# Patient Record
Sex: Female | Born: 2005 | Race: White | Hispanic: No | Marital: Single | Smoking: Never smoker
Health system: Southern US, Community
[De-identification: ages and names within clinical notes are randomized; demographics above are authoritative.]

## PROBLEM LIST (undated history)

## (undated) DIAGNOSIS — F4310 Post-traumatic stress disorder, unspecified: Secondary | ICD-10-CM

## (undated) DIAGNOSIS — F942 Disinhibited attachment disorder of childhood: Secondary | ICD-10-CM

## (undated) DIAGNOSIS — F919 Conduct disorder, unspecified: Secondary | ICD-10-CM

## (undated) DIAGNOSIS — F419 Anxiety disorder, unspecified: Secondary | ICD-10-CM

## (undated) DIAGNOSIS — F909 Attention-deficit hyperactivity disorder, unspecified type: Secondary | ICD-10-CM

## (undated) DIAGNOSIS — F801 Expressive language disorder: Secondary | ICD-10-CM

## (undated) DIAGNOSIS — H539 Unspecified visual disturbance: Secondary | ICD-10-CM

## (undated) DIAGNOSIS — F941 Reactive attachment disorder of childhood: Secondary | ICD-10-CM

## (undated) DIAGNOSIS — F819 Developmental disorder of scholastic skills, unspecified: Secondary | ICD-10-CM

## (undated) DIAGNOSIS — R569 Unspecified convulsions: Secondary | ICD-10-CM

## (undated) HISTORY — DX: Post-traumatic stress disorder, unspecified: F43.10

## (undated) HISTORY — DX: Anxiety disorder, unspecified: F41.9

## (undated) HISTORY — PX: OTHER SURGICAL HISTORY: SHX169

## (undated) HISTORY — DX: Developmental disorder of scholastic skills, unspecified: F81.9

---
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
## 2024-01-22 NOTE — ED Provider Notes (Signed)
 Emergency Department Provider Note    ED Clinical Impression   Final diagnoses:  Seizure-like activity    (Primary)    ED Assessment/Plan    History   Chief Complaint  Patient presents with  . Seizure - Prior History Of   Kathleen Henderson is an 18 year old female with a history of nonepileptiform seizures, who was seen in the emergency department last month for this, who had normal labs and noncontrast head CT, who presents today for the same.  According to triage report, she had what appeared to be a generalized tonic-clonic seizure that lasted about 10 minutes.  There was no urinary incontinence.  No intraoral trauma.  She is currently able to tell me her fianc's name.  She denies any pain or dyspnea.  She is unable to tell me what happened earlier this evening.     Past Medical History[1]  Past Surgical History[2]  Family History[3]  Social History[4]  Review of Systems  Physical Exam   BP 108/66   Pulse 65   Temp 37.2 C (98.9 F) (Oral)   Resp 16   Ht 170.2 cm (5' 7)   Wt 68 kg (150 lb)   SpO2 97%   BMI 23.49 kg/m   Physical Exam Vitals and nursing note reviewed.  Constitutional:      General: She is not in acute distress.    Appearance: Normal appearance. She is well-developed. She is not ill-appearing.  HENT:     Head: Normocephalic.     Mouth/Throat:     Mouth: Mucous membranes are moist.   Eyes:     Extraocular Movements: Extraocular movements intact.     Conjunctiva/sclera: Conjunctivae normal.     Pupils: Pupils are equal, round, and reactive to light.    Cardiovascular:     Rate and Rhythm: Normal rate and regular rhythm.     Heart sounds: No murmur heard. Pulmonary:     Effort: Pulmonary effort is normal. No respiratory distress.     Breath sounds: Normal breath sounds. No wheezing.  Abdominal:     General: There is no distension.     Tenderness: There is no abdominal tenderness.   Musculoskeletal:        General: No swelling or signs  of injury.   Skin:    General: Skin is warm.     Capillary Refill: Capillary refill takes less than 2 seconds.   Neurological:     Mental Status: She is alert.     Motor: No weakness.     Gait: Gait normal.     Comments: Full strength in her arms and legs.  Following commands.  GCS 15.  Psychiatric:        Mood and Affect: Mood normal.     ED Course       Medical Decision Making Tired but well-appearing 18 year old female with normal vital signs, who had a normal noncontrast head CT and labs last month, presents today for witnessed seizure-like activity.  She has a history of nonepileptic form seizure activity.  No further intervention needed.  She denies any thoughts of going to harm her self or others.  I spoke with her fianc, Kathleen Henderson, who is aware of her diagnosis.  He will send one of the roommates to pick her up from the emergency department.  A work note for tomorrow has been provided.                 [1] No past medical history on file. [  2] No past surgical history on file. [3] No family history on file. [4] Social History Socioeconomic History  . Marital status: Single   Social Drivers of Corporate investment banker Strain: Low Risk  (05/16/2023)   Received from [facility]   Overall Physicist, medical Strain (CARDIA)   . Difficulty of Paying Living Expenses: Not hard at all  Food Insecurity: No Food Insecurity (08/28/2023)   [facility]   Hunger Vital Sign   . Within the past 12 months, you worried that your food would run out before you got the money to buy more.: Never true   . Within the past 12 months, the food you bought just didn't last and you didn't have money to get more.: Never true  Transportation Needs: No Transportation Needs (08/28/2023)   Received from [facility] - Transportation   . Lack of Transportation (Medical): No   . Lack of Transportation (Non-Medical): No  Housing: Patient Unable  To Answer (06/07/2023)   [facility]   Housing Stability Vital Sign   . In the last 12 months, was there a time when you were not able to pay the mortgage or rent on time?: Patient unable to answer   . In the past 12 months, how many times have you moved where you were living?: 0   . At any time in the past 12 months, were you homeless or living in a shelter (including now)?: Patient unable to answer   Marnie Doyal Amble, MD 01/23/24 [phone]

## 2024-01-22 NOTE — ED Triage Notes (Signed)
 Pt. arrives via EMS from [facility] r/t to witnessed tonic-clonic seizure lasting approx. 10 minutes, per bystanders.  Pt. has a hx of the same.  Per report, pt. fell to ground striking the left side of her face.  Pt. drowsy but able to respond to questions during triage.

## 2024-01-31 NOTE — Progress Notes (Signed)
 Care Manager Initial Evaluation Note  SW CM met with 18 y.o. Kathleen Henderson was admitted to the [facility] for suicidal ideation. Kathleen Henderson stated that her PCP changed her medication and said if she started to have any suicidal ideation, to stop taking her medication. Kathleen Henderson stated she met with her PCP and suggested that she come to the hospital for medication management and acute stabilization. Kathleen Henderson noted that she has a great support system. Kathleen Henderson will need to establish mental health providers. Kathleen Henderson will not require any transportation upon discharge. Kathleen Henderson will need follow-up appointments scheduled after hospitalization.  Validate Info on Facesheet The following items were validated: Patient's name, Address, Everyday contact #, PCP, Emergency contact, Emergency phone #, Insurance  Nsg Discharge Planning Does the patient need discharge transport arranged?: No  Support and Level of Function Primary role/responsibilities: Wage earner, full-time Do you have someone who is willing and able to assist you after you leave the hospital?: No assistance needed Are you able to perform all of your activities of daily living (ADLs)?: Yes Are you able to perform all of your independent activities of daily living?: Yes  Current PAC Services/Placement Patient in a facility prior to admission: No Current home skilled services: None Current community resources: None Current outpatient skilled services: None Current support group: Mental health counseling, None Current DME utilized: None  Employment Employment Status: employed full-time  Editor, commissioning  In the past 12 months has the Mining engineer, gas, oil, or water company threatened to shut off services in your home?: No  Housing What is your living situation today?: I have a steady place to live Think about the place you live. Do you have problems with any of the following?: None of the above Potentially Unsafe Housing Conditions: unable to  assess  Transportation  In the past 12 months, has lack of reliable transportation kept you from medical appointments, meetings, work or from getting things needed for daily living?: No  Food  Within the past 12 months, you worried that your food would run out before you got money to buy more.: Never true Within the past 12 months, the food you bought just didn't last and you didn't have money to get more.: Never true  Trauma/Safety Assessment: Because violence and abuse happens to a lot of people and affects their health we are asking the following questions: How often does anyone, including family and friends, physically hurt you?: Never How often does anyone, including family and friends, insult or talk down to you?: Never How often does anyone, including family and friends, threaten you with harm?: Never How often does anyone, including family and friends, scream or curse at you?: Never Safety Score: 4 Thoughts of Harm to Others: No How often does anyone exploit you or take your resources unfairly for their benefit?: Never How often does anyone expect or demand you to engage in behaviors that you don't want to do for the their benefit?: Never  Violence Risk Screening Assessment of Violence: None noted History of Violence in Healthcare: No Thoughts of Harm to Others: No  Home Living Patient expresses concerns regarding home layout: No  Transition Needs Evaluation Anticipated discharge disposition: Home or Self Care Post discharge needs: None Ambulatory Palliative Care Consult addressed by CM: No (comment) Support group needs for discharge: Outpatient mental health care Outpatient mental health care: Psychiatric diagnosis Outpatient skilled service needs for discharge: None Does the patient need discharge transport arranged?: No CM Complex Discharge Considerations: None Patient/family/HCA in agreement with plan: Yes Understanding  of Condition and Treatment: adequate  understanding of medical condition, adequate understanding of treatment  Next Eval Due Date Next eval due date: 02/02/24
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
## 2024-02-01 NOTE — Care Plan (Signed)
 Goal Outcome Evaluation:  Plan of Care Reviewed With: patient  Progress: improving
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
## 2024-02-01 NOTE — Group Note (Signed)
 Group Topic: Healing Arts  Group Date: 02/01/2024 Start Time: 1030 End Time: 1145 Facilitators:   Department: [facility]  Number of Attendees: 10  Group Focus: art therapy, coping skills, feeling awareness/expression, self-awareness, and self-esteem Treatment Modality: Art Therapy Interventions utilized were clarification, exploration, problem solving, story telling, support, and music, creativity, grounding, reading Purpose: enhance coping skills, express feelings, express irrational fears, improve communication skills, increase insight, regain self-worth, reinforce self-care, and Using creative mindful self-expression and art therapy process create mixed media artwork following invitation to depict going with the flow and/ or to affirm our self, including in the 5 realms of our being: physical/ body, cognitive, emotional, spiritual, and relational.  Grounding practices: reading and intentional mark-making process. Name: Kathleen Henderson Date of Birth: [birth year]  MR: [id number]    Level of Participation: active  Quality of Participation: engaged Interactions with others: isolative and gave feedback Mood/Affect: anxious, appropriate, and closed / guarded Triggers (if applicable): first day in hospital Cognition: concrete, goal directed, and logical Progress: Moderate Response: Kathleen Bers'  participated in brief initial grounding exercise. She completed mixed media collage artwork following invitation to depict going with the flow and/or to affirm her self, using images and words.  [name] appeared to finish her initial collage quickly, art therapist taught how to make a mini-book. [name] added pictures to her book and added her book to her collage. She shared her artwork, stating how much she is missing her dog, a rescued [place]. Participant reported (+) response to art making  Plan: Continue to offer therapeutic expressive arts groups in  service of treatment goals.  Patients Problems:  Patient Active Problem List  Diagnosis  . Depression with suicidal ideation

## 2024-02-02 NOTE — Discharge Summary (Signed)
 Discharge Summary  Name: Kathleen Henderson DOB / AGE: [birth year] / 18 y.o. 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 She has been in residential facility in the past. Lately she has been more depressed and has some active suicidal thoughts. She was brought to ED for that.   Discharge Diagnosis Depression with suicidal ideation  Recapitulation of the Patient's Hospitalization 07/09: Stop Lexapro . Start Lamictal  25mg  po every day. Order EKG and 500cc of Normal saline due to hypotension and vomitting.Counseling provided with focus on her depression and her anxiety. 07/10: Start Keppra  500mg  po BID and order MRI of head. Counseling provided with focus on her depression and anxiety.  Significant Findings/Tests/Studies MRI negative for acute abnormality Labs No results found for this or any previous visit (from the past 24 hours).  Transition Plan Discharge home and back to outpatient level of care.   A Brief Summary of the Patient's Condition at Discharge Patient is feeling better with medication changes and hopeful to return home. She feels safe and stable.  Mental Status/Cognition Orientation:Alert and oriented x 4 Appearance: Appropriately groomed, appears stated age? Attitude: Appropriately focused on healing  Gait: Age-appropriate Behavior: Cooperative. Attention Span and Concentration: Both intact. Memory: Intact to context and conversation Speech: Normal rate, flow, and volume. Organized and coherent. Language/Fund of knowledge: Consistent with background and education. Thought Process: Organized and coherent. No looseness of associations. Thought Content: No suicidal thoughts, no homicidal thoughts, no delusions, and no psychosis. Thought Perception: No auditory hallucinations, no visual  hallucinations. Mood: depressed, improving Affect: congruent Judgment: Fair Insight: Fair   Visit Vitals BP (!) 103/56  Pulse 53  Temp 36.8 C (98.2 F)  Resp 17  Ht 1.702 m (5' 7)  Wt 67.1 kg (147 lb 14.9 oz)  SpO2 96%  BMI 23.17 kg/m  OB Status Having periods  Smoking Status Every Day  BSA 1.78 m    Discharge Instructions Follow Up:    Thursday Feb 08, 2024   Appointment with [facility] at  1:00 PM   Where: [address] [place] [address]    Tuesday Feb 27, 2024   Appointment with [facility] at  3:30 PM   Where: [address] [place] [address]               Allergies[1] Current Discharge Medication List     START   Details  lamoTRIgine  (LaMICtal ) 25 MG tablet Take 1 tablet (25 mg total) by mouth 1 (one) time each day.    levETIRAcetam  (Keppra ) 500 MG tablet Take 1 tablet (500 mg total) by mouth in the morning and 1 tablet (500 mg total) before bedtime.       CONTINUE these medications which have NOT CHANGED   Details  cholecalciferol (Vitamin D-3) 50 MCG (2000 UT) capsule Take 1 capsule (2,000 Units total) by mouth 1 (one) time each day.    guanFACINE (Intuniv) 1 mg 24 hr tablet Take 1 tablet (1 mg total) by mouth 1 (one) time each day.    naproxen (EC Naprosyn) 375 MG EC tablet Take 1 tablet (375 mg total) by mouth 1 (one) time each day. Do not crush, chew, or split.    pantoprazole  (Protonix ) 20 MG EC tablet Take 1 tablet (20 mg total) by mouth  1 (one) time each day before breakfast. Do not crush, chew, or split.       STOP     escitalopram  (Lexapro ) 5 MG tablet      promethazine  (Phenergan ) 12.5 MG tablet         Professional Services  Time Summary Total time was 45 minutes with the patient in the evaluation. This includes time for chart review, discussion with team, formal time with patient, E and M time, >50% of which was spent in counseling and coordination of care.          [1] Allergies Allergen Reactions  . Shrimp (Diagnostic) Anaphylaxis    States her throat closes  . Penicillins Hives  . Trileptal  [Oxcarbazepine ]   . Wellbutrin [Bupropion]   . Other Food Allergy Rash    Allergic to [employer]

## 2024-02-02 NOTE — Care Plan (Signed)
 Goal Outcome Evaluation  Plan of Care Reviewed With: patient Progress: no change    Patient appears with flat affect but is cooperative. Denies anxiety and depression. Interaction has been minimal. Denies SI and pain. Has had no seizure activity at this time. Isolates and has been resting in room most of day. No behaviors to note. Will continue to monitor Q 15 min.

## 2024-02-02 NOTE — Progress Notes (Signed)
 Progress Note  Name: Kathleen Henderson DOB / AGE: 12-Jul-2006 / 18 y.o. MRN: [id number]  CSN: [id number] Date of Admission: 01/31/2024  Chief Complaint Depression with [facility] Chief Complaint  Patient presents with  . Suicidal    Pt coming from home with a mobile crisis nurse chief complaint is suicidal thoughts. Pt filled out a suicide risk form at her primary care office that alerted mobile crisis. Pt states she has been suicidal for the past 3 weeks. Hx of autism and pseudoseizures.     Subjective/Interval History Reviewed Staff Charting Care discussed with treatment team including nursing and social work.   Interval or Acute Events Patient had an episode of possible seizure. She has history of new onset seizures last year and was placed on Keppra  in the past. She is off Keppra  now for six months. Still depressed and anxious. Seems slightly confused after the episode. Medication Needs Overnight Anxiety and depression Tolerability of Medications and Engagement in Treatment Compliant with medications. Unable to attend groups  HPI  Review of Systems  Vital Signs   02/01/24 1019 02/01/24 1957 02/02/24 0745  BP: (!) 122/59 114/68 (!) 103/56  Pulse: 67 57 53  Resp:  20 17  Temp: 36.6 C (97.9 F) 36.9 C (98.4 F) 36.8 C (98.2 F)  SpO2: 100% 100% 96%      Body mass index is 23.17 kg/m.   Intake/Output Summary (Last 24 hours) at 02/02/2024 1018 Last data filed at 02/02/2024 0900 Gross per 24 hour  Intake 700 ml  Output --  Net 700 ml    Physical Exam  Diagnostic Studies Review Labs No results found for this or any previous visit (from the past 24 hours). Radiology No imaging results in the past day.  ECG Interpretation   Medications calcium carbonate-vitamin D, 1 tablet, Oral, BID cholecalciferol, 2,000 Units, Oral, Daily guanFACINE, 1 mg, Oral, Nightly lamoTRIgine , 25 mg, Oral, Daily levETIRAcetam , 500 mg, Oral, BID multivitamin, 1 tablet, Oral,  Daily naproxen, 250 mg, Oral, BID with meals nicotine, 1 patch, Transdermal, Daily  Followed by NOREEN ON 03/13/2024] nicotine, 1 patch, Transdermal, Daily  Followed by NOREEN ON 03/27/2024] nicotine, 1 patch, Transdermal, Daily pantoprazole , 40 mg, Oral, Daily before breakfast thiamine, 100 mg, Oral, Daily      PRN medications: acetaminophen , acetaminophen , aluminum & magnesium  hydroxide-simethicone, glucose **OR** [DISCONTINUED] glucose, haloperidol lactate, hydrOXYzine  HCl, LORazepam , magnesium  hydroxide, nicotine polacrilex, OLANZapine, ondansetron  ODT, propranolol  Sleep Assessment Sleep Pattern: Disturbed/interrupted sleep Average Number of Sleep Hours: 5 Hours Total Hours of Sleep (Previous Night): 7 Restful Sleep: No (Comment) Difficulty Falling Asleep: Yes (Comment) Difficulty Staying Asleep: Yes (Comment)  Assessment Results   Mental Status/Cognition  Orientation:Alert and oriented x 4 Appearance: Appropriately groomed, appears stated age? Attitude: Appropriately focused on healing but fearful Gait: Age-appropriate Behavior: Cooperative. Attention Span and Concentration: Both intact. Memory: Intact to context and conversation Speech: Normal rate, flow, and volume. Organized and coherent. Language/Fund of knowledge: Consistent with background and education. Thought Process: Organized and coherent. No looseness of associations. Thought Content: No suicidal thoughts, no homicidal thoughts, no delusions, and no psychosis. Thought Perception: No auditory hallucinations, no visual hallucinations. Mood: anxious Affect: Anxious-appearing Judgment: Fair Insight: Fair    Psychiatric Diagnostic Formulation A 18 years old with history of PTSD and increasing depression lately  Diagnostic Impression Problem List         ICD-10-CM Class     Hospital   * (Principal) Depression with suicidal ideation F32.A, R45.851    Other  Visit Diagnoses       Codes     Suicidal  ideation    -  Primary R45.851       Treatment Plan Psychiatric  07/09: Stop Lexapro . Start Lamictal  25mg  po every day. Order EKG and 500cc of Normal saline due to hypotension and vomitting.Counseling provided with focus on her depression and her anxiety. 07/10: Start Keppra  500mg  po BID and order MRI of head. Counseling provided with focus on her depression and anxiety. Metabolic Screen HbA1c ordered Estimated Suicide and/or Violence Risk severe Patient Psychosocial Assets/Strengths  Patient is willing to participate in treatment Patient Psychosocial Liabilities/Weakness  Poor coping skills Disposition/Estimated Length of Stay 3-5 days Chemical Dependency Treatment none Medical POTS, Ehlears-Danlop syndrome.   Professional Services  Time Summary Total time was 55 minutes with the patient in the evaluation. This includes time for chart review, discussion with team, formal time with patient, E and M time. Greater than 50% was spent in counseling and coordination of care.   Counseling Summary I spent 20 minutes in a counseling session with the patient. I utilized a combination of brief supportive therapy and motivational interviewing. Targeted her anxiety and depressvie symptoms? Response to therapy was excellent.? The patient will need ongoing reinforcement.

## 2024-02-02 NOTE — Group Note (Signed)
 Group Topic: Goals  Group Date: 02/02/2024 Start Time: 0915 End Time: 1000 Facilitators:   Department: [facility]  Number of Attendees: 10  Group Focus: goals/reality orientation Treatment Modality: Behavior Modification Therapy, Cognitive Behavioral Therapy, and Psychoeducation Interventions utilized were exploration, goal setting, patient education, and support Purpose: Goal Group seeks to enhance coping skills, explore maladaptive thinking, increase insight, regain self-worth, and reinforce self-care by inviting patients to select 2 goals for direction and confidence-building and developing insight into what thoughts/feelings might interfere. Self-esteem as a practice introduced, as well as checking in on current safety level, with encouragement to come to staff with safety concerns. Name: Kathleen Henderson Date of Birth: Mar 06, 2006  MR: [id number]    Level of Participation: unavailable  Reason: [place] was asleep at the time of group Plan: Cont to invite to groups as they may benefit  Patients Problems:  Patient Active Problem List  Diagnosis  . Depression with suicidal ideation

## 2024-02-03 NOTE — Care Plan (Signed)
 Kathleen Henderson was discharged to her Kathleen Henderson home 02/02/2024 with family providing transportation. She will follow up with Kathleen Henderson for therapy with Kathleen Henderson on Feb 08, 2024 at 1:00 PM, who will offer substance abuse and/or tobacco use counseling. She will follow  up with Kathleen Henderson for medication management at Kathleen Henderson on Feb 27, 2024 3:30 PM. Kathleen Henderson agrees with the plan and verbalizes understanding.

## 2024-02-05 NOTE — Progress Notes (Signed)
 Care Manager Final Note  The patient stabilized in her treatment plan. She is psychiatrically ready for discharge and will be discharged home.   Scheduled Appointments  Feb 08, 2024 1:00 PM Appointment with [facility] [address] [place] [address] [phone]  Provider: Kathern Leopard Feb 27, 2024 3:30 PM Appointment with [facility] [address] [place] [address] [phone]

## 2024-02-15 ENCOUNTER — Ambulatory Visit: Payer: MEDICAID | Admitting: Advanced Practice Midwife

## 2024-02-15 ENCOUNTER — Encounter: Payer: Self-pay | Admitting: Advanced Practice Midwife

## 2024-02-15 ENCOUNTER — Other Ambulatory Visit (HOSPITAL_COMMUNITY)
Admission: RE | Admit: 2024-02-15 | Discharge: 2024-02-15 | Disposition: A | Discharge status: Home / Self Care | Payer: MEDICAID | Source: Ambulatory Visit

## 2024-02-15 ENCOUNTER — Emergency Department (HOSPITAL_COMMUNITY)
Admission: EM | Admit: 2024-02-15 | Discharge: 2024-02-15 | Disposition: A | Discharge status: Home / Self Care | Payer: MEDICAID | Attending: Emergency Medicine | Admitting: Emergency Medicine

## 2024-02-15 ENCOUNTER — Emergency Department (HOSPITAL_COMMUNITY): Payer: MEDICAID

## 2024-02-15 VITALS — BP 104/67 | HR 75

## 2024-02-15 DIAGNOSIS — R569 Unspecified convulsions: Secondary | ICD-10-CM | POA: Diagnosis present

## 2024-02-15 DIAGNOSIS — N898 Other specified noninflammatory disorders of vagina: Secondary | ICD-10-CM | POA: Diagnosis not present

## 2024-02-15 DIAGNOSIS — R350 Frequency of micturition: Secondary | ICD-10-CM | POA: Diagnosis not present

## 2024-02-15 DIAGNOSIS — B9689 Other specified bacterial agents as the cause of diseases classified elsewhere: Secondary | ICD-10-CM | POA: Diagnosis not present

## 2024-02-15 DIAGNOSIS — N926 Irregular menstruation, unspecified: Secondary | ICD-10-CM

## 2024-02-15 LAB — BASIC METABOLIC PANEL WITH GFR
Anion gap: 7 (ref 5–15)
BUN: 10 mg/dL (ref 6–20)
CO2: 22 mmol/L (ref 22–32)
Calcium: 8.4 mg/dL — ABNORMAL LOW (ref 8.9–10.3)
Chloride: 107 mmol/L (ref 98–111)
Creatinine, Ser: 0.85 mg/dL (ref 0.44–1.00)
GFR, Estimated: >60 mL/min (ref >60)
Glucose, Bld: 95 mg/dL (ref 70–99)
Potassium: 4 mmol/L (ref 3.5–5.1)
Sodium: 136 mmol/L (ref 135–145)

## 2024-02-15 LAB — CBC
HCT: 33.3 % — ABNORMAL LOW (ref 36.0–46.0)
Hemoglobin: 10.9 g/dL — ABNORMAL LOW (ref 12.0–15.0)
MCH: 27.9 pg (ref 26.0–34.0)
MCHC: 32.7 g/dL (ref 30.0–36.0)
MCV: 85.2 fL (ref 80.0–100.0)
Platelets: 234 K/uL (ref 150–400)
RBC: 3.91 MIL/uL (ref 3.87–5.11)
RDW: 13.8 % (ref 11.5–15.5)
WBC: 7.2 K/uL (ref 4.0–10.5)
nRBC: 0 % (ref 0.0–0.2)

## 2024-02-15 LAB — I-STAT CHEM 8, ED
BUN: 10 mg/dL (ref 6–20)
Calcium, Ion: 1.2 mmol/L (ref 1.15–1.40)
Chloride: 105 mmol/L (ref 98–111)
Creatinine, Ser: 0.9 mg/dL (ref 0.44–1.00)
Glucose, Bld: 92 mg/dL (ref 70–99)
HCT: 30 % — ABNORMAL LOW (ref 36.0–46.0)
Hemoglobin: 10.2 g/dL — ABNORMAL LOW (ref 12.0–15.0)
Potassium: 3.8 mmol/L (ref 3.5–5.1)
Sodium: 140 mmol/L (ref 135–145)
TCO2: 22 mmol/L (ref 22–32)

## 2024-02-15 LAB — POCT URINALYSIS DIPSTICK OB
Glucose, UA: NEGATIVE
Ketones, UA: NEGATIVE
Nitrite, UA: NEGATIVE
POC,PROTEIN,UA: NEGATIVE

## 2024-02-15 LAB — CK: Total CK: 96 U/L (ref 38–234)

## 2024-02-15 LAB — POCT URINE PREGNANCY: Preg Test, Ur: NEGATIVE

## 2024-02-15 LAB — HCG, SERUM, QUALITATIVE: Preg, Serum: NEGATIVE

## 2024-02-15 LAB — CBG MONITORING, ED: Glucose-Capillary: 94 mg/dL (ref 70–99)

## 2024-02-15 MED ORDER — LACTATED RINGERS IV BOLUS
1000.0000 mL | Freq: Once | INTRAVENOUS | Status: AC
  Administered 2024-02-15: 1000 mL via INTRAVENOUS

## 2024-02-15 MED ORDER — METRONIDAZOLE 500 MG PO TABS: 500.0000 mg | ORAL_TABLET | Freq: Two times a day (BID) | ORAL | 0 refills | Status: AC

## 2024-02-15 MED ORDER — LEVETIRACETAM (KEPPRA) 500 MG/5 ML ADULT IV PUSH: 1500.0000 mg | Freq: Once | INTRAVENOUS | Status: DC

## 2024-02-15 MED ORDER — PNV PRENATAL PLUS MULTIVITAMIN 27-1 MG PO TABS: 1.0000 | ORAL_TABLET | Freq: Every day | ORAL | 11 refills | Status: AC

## 2024-02-15 NOTE — ED Provider Notes (Signed)
 [place] EMERGENCY DEPARTMENT AT [facility] Provider Note  CSN: [id number] Arrival date & time: 02/15/24 1436  Chief Complaint(s) Seizures  HPI Kathleen Henderson is a 18 y.o. female with past medical history as below, significant for ADHD, anxiety,, disorder, PTSD, psychogenic nonepileptic seizures, MDD, who presents to the ED with complaint of seizure like activity  According to EMS patient was at the pool, she had report of 3 episodes of convulsions, seizure-like activity.  She was given Versed intramuscular by EMS and upon arrival she is sleepy, possibly postictal but she was recently sedated.  Prior to receiving the Versed EMS reports that patient reported that she was compliant with her Keppra .  No seizure activity upon arrival to the ER.    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, disinhibited type    Seizures (HCC)    psychogenic, nonepileptic   Vision abnormalities    Pt reports she wears glasses at school   Patient Active Problem List   Diagnosis Date Noted   Psychogenic nonepileptic seizure 08/29/2023   Anxiety and depression 08/28/2023   Seizure-like activity (HCC) 08/27/2023   GERD (gastroesophageal reflux disease) 08/27/2023   Witnessed seizure-like activity (HCC) 08/13/2023   MDD (major depressive disorder), recurrent episode, severe (HCC) 08/28/2021   Suicide ideation 12/04/2019   Attention deficit hyperactivity disorder (ADHD) 12/04/2019   Menorrhagia with irregular cycle 11/13/2018   Encounter for menstrual regulation 11/13/2018   Home Medication(s) Prior to Admission medications   Medication Sig Start Date End Date Taking? Authorizing Provider  Cholecalciferol (VITAMIN D3) 1.25 MG (50000  UT) CAPS Take 1 capsule by mouth once a week. 08/26/23   [provider]  EPINEPHrine (PRIMATENE MIST) 0.125 MG/ACT AERO Inhale 1 puff into the lungs.    [provider]  fluticasone  (FLONASE ) 50 MCG/ACT nasal spray Place 1 spray into both nostrils daily. 08/30/23   [provider]  guanFACINE (TENEX) 1 MG tablet Take 1 mg by mouth at bedtime.    [provider]  lamoTRIgine  (LAMICTAL ) 100 MG tablet Take 1 tablet (100 mg total) by mouth daily. Patient not taking: Reported on 02/15/2024 10/03/23 10/02/24  Sebastian Toribio GAILS, MD  lamoTRIgine  (LAMICTAL ) 25 MG tablet Take 1 tablet (25mg ) once daily for 2 weeks, then 2 tablets (50mg ) once daily for 2 weeks, then 4 tablets (100mg ) once daily for one week 08/29/23   Sebastian Toribio GAILS, MD  levETIRAcetam  (KEPPRA ) 500 MG tablet Take 500 mg by mouth 2 (two) times daily.    [provider]  metroNIDAZOLE  (FLAGYL ) 500 MG tablet Take 1 tablet (500 mg total) by mouth 2 (two) times daily. 02/15/24   Cresenzo-Dishmon, Cathlean, CNM  naproxen (NAPROSYN) 500 MG tablet Take 500 mg by mouth 2 (two) times daily as needed. 10/19/23   [provider]  ondansetron  (ZOFRAN -ODT) 4 MG disintegrating tablet Take 1 tablet (4 mg total) by mouth every 8 (eight) hours as needed. 08/15/23   Chandra Harlene LABOR, NP  OVER THE COUNTER MEDICATION Take 1 tablet by mouth daily as needed (nausea, vomiting). Unknown OTC nausea medication    [provider]  pantoprazole  (PROTONIX ) 40 MG tablet Take 1 tablet (40 mg total) by mouth at bedtime. 08/29/23  Sebastian Toribio GAILS, MD  Prenatal Vit-Fe Fumarate-FA (PNV PRENATAL PLUS MULTIVITAMIN) 27-1 MG TABS Take 1 tablet by mouth daily. 02/15/24   Newton Mering, CNM                                                                                                                                    Past Surgical History Past Surgical History:  Procedure Laterality Date   left arm surgery      Family History Family History  Adopted: Yes  Problem Relation Age of Onset   Breast cancer Maternal Grandmother    Cancer Maternal Grandfather        bladder   Bipolar disorder Mother    Schizophrenia Mother    ADD / ADHD Brother     Social History Social History   Tobacco Use   Smoking status: Never   Smokeless tobacco: Never  Vaping Use   Vaping status: Some Days  Substance Use Topics   Alcohol use: No   Drug use: No   Allergies Cheese, Penicillins, Trileptal  [oxcarbazepine ], and Wellbutrin [bupropion]  Review of Systems A thorough review of systems was obtained and all systems are negative except as noted in the HPI and PMH.   Physical Exam Vital Signs  I have reviewed the triage vital signs BP (!) 96/58   Pulse (!) 50   Temp 98.2 F (36.8 C) (Axillary)   Resp 17   Ht 5' 5 (1.651 m)   Wt 61.2 kg   LMP 01/04/2024   SpO2 100%   BMI 22.45 kg/m  Physical Exam Vitals and nursing note reviewed.  Constitutional:      General: She is not in acute distress.    Appearance: Normal appearance. She is well-developed. She is not ill-appearing.  HENT:     Head: Normocephalic and atraumatic.     Right Ear: External ear normal.     Left Ear: External ear normal.     Nose: Nose normal.     Mouth/Throat:     Mouth: Mucous membranes are moist.  Eyes:     General: No scleral icterus.       Right eye: No discharge.        Left eye: No discharge.     Pupils: Pupils are equal, round, and reactive to light.  Cardiovascular:     Rate and Rhythm: Normal rate.  Pulmonary:     Effort: Pulmonary effort is normal. No respiratory distress.     Breath sounds: No stridor.  Abdominal:     General: Abdomen is flat. There is no distension.     Tenderness: There is no guarding.  Musculoskeletal:        General: No deformity.     Cervical back: No rigidity.  Skin:    General: Skin is warm and dry.     Coloration: Skin is not cyanotic, jaundiced or pale.  Neurological:  Mental Status: She is lethargic.     Cranial Nerves: No facial asymmetry.     Motor: No tremor.     Comments: Withdraws from noxious stimulus to all 4 extremities Patient is sedated Not compliant w/ neuro exam  Psychiatric:        Speech: Speech normal.        Behavior: Behavior normal. Behavior is cooperative.     ED Results and Treatments Labs (all labs ordered are listed, but only abnormal results are displayed) Labs Reviewed  BASIC METABOLIC PANEL WITH GFR - Abnormal; Notable for the following components:      Result Value   Calcium 8.4 (*)    All other components within normal limits  I-STAT CHEM 8, ED - Abnormal; Notable for the following components:   Hemoglobin 10.2 (*)    HCT 30.0 (*)    All other components within normal limits  HCG, SERUM, QUALITATIVE  CK  LEVETIRACETAM  LEVEL  CBC  CBG MONITORING, ED                                                                                                                          Radiology No results found.  Pertinent labs & imaging results that were available during my care of the patient were reviewed by me and considered in my medical decision making (see MDM for details).  Medications Ordered in ED Medications  lactated ringers  bolus 1,000 mL (1,000 mLs Intravenous New Bag/Given 02/15/24 1531)                                                                                                                                     Procedures Procedures  (including critical care time)  Medical Decision Making / ED Course    Medical Decision Making:    Kathleen KERRIA SAPIEN is a 18 y.o. female  with past medical history as below, significant for ADHD, anxiety,, disorder, PTSD, psychogenic nonepileptic seizures, MDD, who presents to the ED with complaint of seizure like activity. The complaint involves an extensive differential diagnosis and also carries with it a high risk of complications and morbidity.  Serious etiology  was considered. Ddx includes but is not limited to: Differential diagnoses for altered mental status includes but is not exclusive to alcohol, illicit or prescription medications, intracranial pathology such as stroke, intracerebral hemorrhage, fever or infectious causes including sepsis, hypoxemia, uremia, trauma,  endocrine related disorders such as diabetes, hypoglycemia, thyroid -related diseases, etc.   Complete initial physical exam performed, notably the patient was in she has some what somnolent, no external signs of trauma.    Reviewed and confirmed nursing documentation for past medical history, family history, social history.  Vital signs reviewed.    Seizure like activity > - History of psychogenic nonepileptic seizures, on Keppra . - Report of seizure activity prior to arrival, no seizure activity in the ER. - Labs are reassuring so far, mental status seems to be improving.  No seizure activity in the ER. - Handoff to incoming EDP pending recheck of mental status and return to baseline                     Additional history obtained: -Additional history obtained from ems -External records from outside source obtained and reviewed including: Chart review including previous notes, labs, imaging, consultation notes including  Home medications, primary care documentation   Lab Tests: -I ordered, reviewed, and interpreted labs.   The pertinent results include:   Labs Reviewed  BASIC METABOLIC PANEL WITH GFR - Abnormal; Notable for the following components:      Result Value   Calcium 8.4 (*)    All other components within normal limits  I-STAT CHEM 8, ED - Abnormal; Notable for the following components:   Hemoglobin 10.2 (*)    HCT 30.0 (*)    All other components within normal limits  HCG, SERUM, QUALITATIVE  CK  LEVETIRACETAM  LEVEL  CBC  CBG MONITORING, ED    Notable for Keppra  level and CBC are pending, labs otherwise are stable  EKG   EKG  Interpretation Date/Time:  Thursday February 15 2024 14:45:16 EDT Ventricular Rate:  63 PR Interval:  165 QRS Duration:  79 QT Interval:  390 QTC Calculation: 400 R Axis:   75  Text Interpretation: Sinus rhythm Low voltage, precordial leads Confirmed by Elnor Savant ([id number]) on 02/15/2024 3:55:11 PM         Imaging Studies ordered: Not applicable   Medicines ordered and prescription drug management: Meds ordered this encounter  Medications   DISCONTD: levETIRAcetam  (KEPPRA ) undiluted injection 1,500 mg   lactated ringers  bolus 1,000 mL    -I have reviewed the patients home medicines and have made adjustments as needed   Consultations Obtained: Not applicable  Cardiac Monitoring: The patient was maintained on a cardiac monitor.  I personally viewed and interpreted the cardiac monitored which showed an underlying rhythm of: Sinus bradycardia Continuous pulse oximetry interpreted by myself, 100% on RA.    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    Final Clinical Impression(s) / ED Diagnoses Final diagnoses:  None        Elnor Jayson LABOR, DO 02/15/24 1653

## 2024-02-15 NOTE — ED Notes (Signed)
CBC collected and sent

## 2024-02-15 NOTE — Discharge Instructions (Signed)
 Per [facility] statutes, patients with seizures are not allowed to drive until they have been seizure-free for six months.  Other recommendations include using caution when using heavy equipment or power tools. Avoid working on ladders or at heights. Take showers instead of baths.  Do not swim alone.  Ensure the water temperature is not too high on the home water heater. Do not go swimming alone. Do not lock yourself in a room alone (i.e. bathroom). When caring for infants or small children, sit down when holding, feeding, or changing them to minimize risk of injury to the child in the event you have a seizure. Maintain good sleep hygiene. Avoid alcohol.  Also recommend adequate sleep, hydration, good diet and minimize stress.     During the Seizure   - First, ensure adequate ventilation and place patients on the floor on their left side  Loosen clothing around the neck and ensure the airway is patent. If the patient is clenching the teeth, do not force the mouth open with any object as this can cause severe damage - Remove all items from the surrounding that can be hazardous. The patient may be oblivious to what's happening and may not even know what he or she is doing. If the patient is confused and wandering, either gently guide him/her away and block access to outside areas - Reassure the individual and be comforting - Call 911. In most cases, the seizure ends before EMS arrives. However, there are cases when seizures may last over 3 to 5 minutes. Or the individual may have developed breathing difficulties or severe injuries. If a pregnant patient or a person with diabetes develops a seizure, it is prudent to call an ambulance. - Finally, if the patient does not regain full consciousness, then call EMS. Most patients will remain confused for about 45 to 90 minutes after a seizure, so you must use judgment in calling for help. - Avoid restraints but make sure the patient is in a bed with  padded side rails - Place the individual in a lateral position with the neck slightly flexed; this will help the saliva drain from the mouth and prevent the tongue from falling backward - Remove all nearby furniture and other hazards from the area - Provide verbal assurance as the individual is regaining consciousness - Provide the patient with privacy if possible - Call for help and start treatment as ordered by the caregiver   After the Seizure (Postictal Stage)   After a seizure, most patients experience confusion, fatigue, muscle pain and/or a headache. Thus, one should permit the individual to sleep. For the next few days, reassurance is essential. Being calm and helping reorient the person is also of importance.   Most seizures are painless and end spontaneously. Seizures are not harmful to others but can lead to complications such as stress on the lungs, brain and the heart. Individuals with prior lung problems may develop labored breathing and respiratory distress.

## 2024-02-15 NOTE — Progress Notes (Signed)
 [facility] Clinic Visit  Patient name: Kathleen Henderson MRN [id number]  Date of birth: [birth year]  CC & HPI:  Kathleen Henderson is a 18 y.o. Caucasian female presenting today for period 6 days late, increased vaginal DC and intermittent abdominal pain.   Never been this late w/period, not on BC. Declines.  Will rx PNV DC is white and creamy, denies burning or itching For the past several days, has had brief random pains in various parts of upper abdomen Has had instances where she voids and then a little while later, voids (large amounts) again.  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125 MG/ACT AERO, Inhale 1 puff into the lungs., Disp: , Rfl:    fluticasone  (FLONASE ) 50 MCG/ACT nasal spray, Place 1 spray into both nostrils daily., Disp: , Rfl:    guanFACINE (TENEX) 1 MG tablet, Take 1 mg by mouth at bedtime., Disp: , Rfl:    lamoTRIgine  (LAMICTAL ) 25 MG tablet, Take 1 tablet (25mg ) once daily for 2 weeks, then 2 tablets (50mg ) once daily for 2 weeks, then 4 tablets  (100mg ) once daily for one week, Disp: 70 tablet, Rfl: 0   levETIRAcetam  (KEPPRA ) 500 MG tablet, Take 500 mg by mouth 2 (two) times daily., Disp: , Rfl:    metroNIDAZOLE  (FLAGYL ) 500 MG tablet, Take 1 tablet (500 mg total) by mouth 2 (two) times daily., Disp: 14 tablet, Rfl: 0   naproxen (NAPROSYN) 500 MG tablet, Take 500 mg by mouth 2 (two) times daily as needed., Disp: , Rfl:    ondansetron  (ZOFRAN -ODT) 4 MG disintegrating tablet, Take 1 tablet (4 mg total) by mouth every 8 (eight) hours as needed., Disp: 20 tablet, Rfl: 0   OVER THE COUNTER MEDICATION, Take 1 tablet by mouth daily as needed (nausea, vomiting). Unknown OTC nausea medication, Disp: , Rfl:    pantoprazole  (PROTONIX ) 40 MG tablet, Take 1 tablet (40 mg total) by mouth at bedtime., Disp: 30 tablet, Rfl: 1   Prenatal Vit-Fe Fumarate-FA (PNV PRENATAL PLUS MULTIVITAMIN) 27-1 MG TABS, Take 1 tablet by mouth daily., Disp: 30 tablet, Rfl: 11   lamoTRIgine  (LAMICTAL ) 100 MG tablet, Take 1 tablet (100 mg total) by mouth daily. (Patient not taking: Reported on 02/15/2024), Disp: 30 tablet, Rfl: 2 Social History: Reviewed -  reports that she has never smoked. She has never used smokeless tobacco.  Review of Systems:   Constitutional: Negative for fever  and chills Eyes: Negative for visual disturbances Respiratory: Negative for shortness of breath, dyspnea Cardiovascular: Negative for chest pain or palpitations  Gastrointestinal: Negative for vomiting, diarrhea and constipation; no abdominal pain Genitourinary: Negative for dysuria and urgency, vaginal irritation or itching Musculoskeletal: Negative for back pain, joint pain, myalgias  Neurological: Negative for dizziness and headaches    Objective Findings:    Physical Examination: Vitals:   02/15/24 0954  BP: 104/67  Pulse: 75   General appearance - well appearing, and in no distress Mental status - alert, oriented to person, place, and time Chest:  Normal respiratory  effort Heart - normal rate and regular rhythm Abdomen:  Soft, nontender Pelvic: SSE:  white frothy dc w/amine odor.  Wet prep neg trich/WBC/yeast; + few clue Musculoskeletal:  Normal range of motion without pain Extremities:  No edema    Results for orders placed or performed in visit on 02/15/24 (from the past 24 hours)  POCT urine pregnancy   Collection Time: 02/15/24  9:54 AM  Result Value Ref Range   Preg Test, Ur Negative Negative  POC Urinalysis Dipstick OB   Collection Time: 02/15/24 10:32 AM  Result Value Ref Range   Color, UA     Clarity, UA     Glucose, UA Negative Negative   Bilirubin, UA     Ketones, UA neg    Spec Grav, UA     Blood, UA trace    pH, UA     POC,PROTEIN,UA Negative Negative, Trace, Small (1+), Moderate (2+), Large (3+), 4+   Urobilinogen, UA     Nitrite, UA neg    Leukocytes, UA Small (1+) (A) Negative   Appearance     Odor        Assessment & Plan:  A/P:   Missed period:  check Qhcg  BV:  rx flagyl   Occ abd pain:  ? Intestinal spams  + hematuria:  r/o UTI   Orders Placed This Encounter  Procedures   Urine Culture   Beta hCG quant (ref lab)   POCT urine pregnancy   POC Urinalysis Dipstick OB       No follow-ups on file.  Cathlean Ely CNM 02/15/2024 1:37 PM

## 2024-02-15 NOTE — ED Notes (Signed)
 Lav top collected and sent to lab

## 2024-02-15 NOTE — ED Triage Notes (Signed)
 Pt BIB EMS from swimming pool, had 3 seizures. First seizure in pool and went underwater for about 10 seconds before lifeguards got to her and pulled her out. 3rd seizure 30 seconds witnessed by EMS. 5mg  Versed given IM by EMS. Hx of seizures, takes Keppra  AM & PM and is compliant with meds. Pt carrying epinephrine inhaler that is for her seizures per her conversation with EMS prior to arrival. Pt postictal on arrival.

## 2024-02-15 NOTE — ED Provider Notes (Signed)
 Pt with hx of sz on keppra .  Had several sz today and given versed by EMS and pt is very drowsy from versed but otherwise intact.  Labs reassuring and pt is not pregnant.  Waiting for pt to wake up from meds. Pt is still a little sleepy but easliy arousable and would like to go home.  She will continue her current meds and f/u with neuro if she continues to have episodes.   Doretha Folks, MD 02/15/24 1758

## 2024-02-16 LAB — CERVICOVAGINAL ANCILLARY ONLY
Chlamydia: NEGATIVE
Comment: NEGATIVE
Comment: NEGATIVE
Comment: NORMAL
Neisseria Gonorrhea: NEGATIVE
Trichomonas: NEGATIVE

## 2024-02-16 LAB — BETA HCG QUANT (REF LAB): hCG Quant: <1 m[IU]/mL

## 2024-02-17 LAB — URINE CULTURE

## 2024-02-18 ENCOUNTER — Encounter: Payer: Self-pay | Admitting: Advanced Practice Midwife

## 2024-02-18 LAB — LEVETIRACETAM LEVEL: Levetiracetam Lvl: 10.2 ug/mL (ref 10.0–40.0)

## 2024-02-22 ENCOUNTER — Emergency Department (HOSPITAL_COMMUNITY): Payer: MEDICAID

## 2024-02-22 ENCOUNTER — Other Ambulatory Visit: Payer: Self-pay

## 2024-02-22 ENCOUNTER — Emergency Department (HOSPITAL_COMMUNITY)
Admission: EM | Admit: 2024-02-22 | Discharge: 2024-02-22 | Disposition: A | Discharge status: Home / Self Care | Payer: MEDICAID | Attending: Emergency Medicine | Admitting: Emergency Medicine

## 2024-02-22 ENCOUNTER — Encounter (HOSPITAL_COMMUNITY): Payer: Self-pay

## 2024-02-22 DIAGNOSIS — R569 Unspecified convulsions: Secondary | ICD-10-CM | POA: Insufficient documentation

## 2024-02-22 LAB — COMPREHENSIVE METABOLIC PANEL WITH GFR
ALT: 12 U/L (ref 0–44)
AST: 17 U/L (ref 15–41)
Albumin: 3.4 g/dL — ABNORMAL LOW (ref 3.5–5.0)
Alkaline Phosphatase: 67 U/L (ref 38–126)
Anion gap: 9 (ref 5–15)
BUN: 7 mg/dL (ref 6–20)
CO2: 22 mmol/L (ref 22–32)
Calcium: 9.1 mg/dL (ref 8.9–10.3)
Chloride: 108 mmol/L (ref 98–111)
Creatinine, Ser: 0.76 mg/dL (ref 0.44–1.00)
GFR, Estimated: >60 mL/min (ref >60)
Glucose, Bld: 102 mg/dL — ABNORMAL HIGH (ref 70–99)
Potassium: 3.4 mmol/L — ABNORMAL LOW (ref 3.5–5.1)
Sodium: 139 mmol/L (ref 135–145)
Total Bilirubin: 0.4 mg/dL (ref 0.0–1.2)
Total Protein: 6.4 g/dL — ABNORMAL LOW (ref 6.5–8.1)

## 2024-02-22 LAB — CBC WITH DIFFERENTIAL/PLATELET
Abs Immature Granulocytes: 0.02 K/uL (ref 0.00–0.07)
Basophils Absolute: 0 K/uL (ref 0.0–0.1)
Basophils Relative: 0 %
Eosinophils Absolute: 0.2 K/uL (ref 0.0–0.5)
Eosinophils Relative: 3 %
HCT: 34.4 % — ABNORMAL LOW (ref 36.0–46.0)
Hemoglobin: 11.4 g/dL — ABNORMAL LOW (ref 12.0–15.0)
Immature Granulocytes: 0 %
Lymphocytes Relative: 41 %
Lymphs Abs: 3.2 K/uL (ref 0.7–4.0)
MCH: 27.7 pg (ref 26.0–34.0)
MCHC: 33.1 g/dL (ref 30.0–36.0)
MCV: 83.7 fL (ref 80.0–100.0)
Monocytes Absolute: 0.7 K/uL (ref 0.1–1.0)
Monocytes Relative: 9 %
Neutro Abs: 3.7 K/uL (ref 1.7–7.7)
Neutrophils Relative %: 47 %
Platelets: 246 K/uL (ref 150–400)
RBC: 4.11 MIL/uL (ref 3.87–5.11)
RDW: 13.6 % (ref 11.5–15.5)
WBC: 7.9 K/uL (ref 4.0–10.5)
nRBC: 0 % (ref 0.0–0.2)

## 2024-02-22 LAB — RAPID URINE DRUG SCREEN, HOSP PERFORMED
Amphetamines: NOT DETECTED
Barbiturates: NOT DETECTED
Benzodiazepines: NOT DETECTED
Cocaine: NOT DETECTED
Opiates: NOT DETECTED
Tetrahydrocannabinol: NOT DETECTED

## 2024-02-22 LAB — CBG MONITORING, ED: Glucose-Capillary: 97 mg/dL (ref 70–99)

## 2024-02-22 LAB — ETHANOL: Alcohol, Ethyl (B): <15 mg/dL (ref <15)

## 2024-02-22 LAB — HCG, QUANTITATIVE, PREGNANCY: hCG, Beta Chain, Quant, S: <1 m[IU]/mL (ref <5)

## 2024-02-22 MED ORDER — LACTATED RINGERS IV BOLUS
1000.0000 mL | Freq: Once | INTRAVENOUS | Status: AC
  Administered 2024-02-22: 1000 mL via INTRAVENOUS

## 2024-02-22 NOTE — ED Notes (Signed)
 Pt refusing PO challenge requested by EDP. Pt states, I'm really sleepy, and I'm just not hungry or thirsty.

## 2024-02-22 NOTE — ED Triage Notes (Signed)
 PT was BIB [facility] for seizure per EMS PT was found in the floor at home, PT has a history of seizures, PT is alert when spoken to, and states she didn't take anything tonight. PT states she takes her meds. VSS and PT states neck pain unsure of fall. EMS states multiple beer/liquor and drugs in the location PT was found. Pupils equal and reactive. NS bolus given en route

## 2024-02-22 NOTE — ED Provider Notes (Signed)
 [place] EMERGENCY DEPARTMENT AT [facility] Provider Note   CSN: [id number] Arrival date & time: 02/22/24  9457     Patient presents with: Seizures   Kathleen Henderson is a 18 y.o. female.  {Add pertinent medical, surgical, social history, OB history to YEP:67052} Patient with previously diagnosed non-epileptiform seizure disorder. Apparently ahd an episode tonight. States that she doesn't remember anything after going to bed. Has a posterior pain that feels like burning. No neuro changes. Has difficulty eating secondary to emesis shortly after eating. No recent illnesses, fever, cough, urinary symptoms or other GI symptoms. Denies etoh, drugs, tobacco. Denies trauma.    Seizures      Prior to Admission medications   Medication Sig Start Date End Date Taking? Authorizing Provider  Cholecalciferol (VITAMIN D3) 1.25 MG (50000 UT) CAPS Take 1 capsule by mouth once a week. 08/26/23   [provider]  EPINEPHrine (PRIMATENE MIST) 0.125 MG/ACT AERO Inhale 1 puff into the lungs.    [provider]  fluticasone  (FLONASE ) 50 MCG/ACT nasal spray Place 1 spray into both nostrils daily. 08/30/23   [provider]  guanFACINE (TENEX) 1 MG tablet Take 1 mg by mouth at bedtime.    [provider]  lamoTRIgine  (LAMICTAL ) 100 MG tablet Take 1 tablet (100 mg total) by mouth daily. Patient not taking: Reported on 02/15/2024 10/03/23 10/02/24  Sebastian Toribio GAILS, MD  lamoTRIgine  (LAMICTAL ) 25 MG tablet Take 1 tablet (25mg ) once daily for 2 weeks, then 2 tablets (50mg ) once daily for 2 weeks, then 4 tablets (100mg ) once daily for one week 08/29/23   Sebastian Toribio GAILS, MD  levETIRAcetam  (KEPPRA ) 500 MG tablet Take 500 mg by mouth 2 (two) times daily.    [provider]  metroNIDAZOLE  (FLAGYL ) 500 MG tablet Take 1 tablet (500 mg total) by mouth 2 (two) times daily. 02/15/24   Cresenzo-Dishmon, Cathlean, CNM  naproxen (NAPROSYN) 500 MG tablet Take 500 mg by mouth  2 (two) times daily as needed. 10/19/23   [provider]  ondansetron  (ZOFRAN -ODT) 4 MG disintegrating tablet Take 1 tablet (4 mg total) by mouth every 8 (eight) hours as needed. 08/15/23   Chandra Harlene LABOR, NP  OVER THE COUNTER MEDICATION Take 1 tablet by mouth daily as needed (nausea, vomiting). Unknown OTC nausea medication    [provider]  pantoprazole  (PROTONIX ) 40 MG tablet Take 1 tablet (40 mg total) by mouth at bedtime. 08/29/23   Sebastian Toribio GAILS, MD  Prenatal Vit-Fe Fumarate-FA (PNV PRENATAL PLUS MULTIVITAMIN) 27-1 MG TABS Take 1 tablet by mouth daily. 02/15/24   Cresenzo-Dishmon, Cathlean, CNM    Allergies: Cheese, Penicillins, Trileptal  [oxcarbazepine ], and Wellbutrin [bupropion]    Review of Systems  Neurological:  Positive for seizures.    Updated Vital Signs BP 121/79   Pulse (!) 53   Temp 97.9 F (36.6 C)   Resp 16   Ht 5' 5 (1.651 m)   Wt 61.2 kg   LMP 01/04/2024   SpO2 100%   BMI 22.47 kg/m   Physical Exam Vitals and nursing note reviewed.  Constitutional:      Appearance: She is well-developed.     Comments: Sleepy but oriented  HENT:     Head: Normocephalic and atraumatic.      Comments: TTP over occiput area, no swelling or deformity Cardiovascular:     Rate and Rhythm: Normal rate and regular rhythm.  Pulmonary:     Effort: No respiratory distress.  Breath sounds: No stridor.  Abdominal:     General: There is no distension.  Musculoskeletal:     Cervical back: Normal range of motion.  Skin:    Coloration: Skin is pale.  Neurological:     Mental Status: She is alert.     (all labs ordered are listed, but only abnormal results are displayed) Labs Reviewed  CBC WITH DIFFERENTIAL/PLATELET  COMPREHENSIVE METABOLIC PANEL WITH GFR  HCG, QUANTITATIVE, PREGNANCY  ETHANOL  RAPID URINE DRUG SCREEN, HOSP PERFORMED  LEVETIRACETAM  LEVEL  LAMOTRIGINE  LEVEL  CBG MONITORING, ED    EKG: None  Radiology: No results  found.  {Document cardiac monitor, telemetry assessment procedure when appropriate:32947} Procedures   Medications Ordered in the ED  lactated ringers  bolus 1,000 mL (1,000 mLs Intravenous New Bag/Given 02/22/24 0617)      {Click here for ABCD2, HEART and other calculators REFRESH Note before signing:1}                              Medical Decision Making Amount and/or Complexity of Data Reviewed Labs: ordered. Radiology: ordered. ECG/medicine tests: ordered.   Ct/head/neck. Basic workup for dehydration. Suspect possible PUD vs gastritis for her vomiting issues.   {Document critical care time when appropriate  Document review of labs and clinical decision tools ie CHADS2VASC2, etc  Document your independent review of radiology images and any outside records  Document your discussion with family members, caretakers and with consultants  Document social determinants of health affecting pt's care  Document your decision making why or why not admission, treatments were needed:32947:::1}   Final diagnoses:  None    ED Discharge Orders     None

## 2024-02-22 NOTE — Discharge Instructions (Addendum)
 Make an appointment to follow-up with your primary care doctor or neurologist.  Return to the emergency room if you have any worsening symptoms.

## 2024-02-22 NOTE — ED Provider Notes (Signed)
 Care was taken over from Dr. Lorette.  Patient has a history of nonepileptic seizures.  She had some seizure-like activity today.  She is awake and interactive.  She has had no ongoing vomiting.  She was discharged home in good condition.  Was encouraged to follow-up with her PCP or neurologist.  Return precautions were given.  Dr. Lorette had ordered drug levels for her Keppra  and Lamictal .  These have not resulted and potentially are send out labs.  Would not change current management.  She can follow-up the values on her MyChart account or with her PCP.   Lenor Hollering, MD 02/22/24 [phone]

## 2024-02-23 ENCOUNTER — Emergency Department (HOSPITAL_COMMUNITY)
Admission: EM | Admit: 2024-02-23 | Discharge: 2024-02-23 | Disposition: A | Discharge status: Home / Self Care | Payer: MEDICAID | Attending: Emergency Medicine | Admitting: Emergency Medicine

## 2024-02-23 ENCOUNTER — Other Ambulatory Visit: Payer: Self-pay

## 2024-02-23 DIAGNOSIS — R569 Unspecified convulsions: Secondary | ICD-10-CM

## 2024-02-23 DIAGNOSIS — F445 Conversion disorder with seizures or convulsions: Secondary | ICD-10-CM | POA: Insufficient documentation

## 2024-02-23 LAB — CBG MONITORING, ED: Glucose-Capillary: 105 mg/dL — ABNORMAL HIGH (ref 70–99)

## 2024-02-23 LAB — LAMOTRIGINE LEVEL: Lamotrigine Lvl: <1 ug/mL — ABNORMAL LOW (ref 2.0–20.0)

## 2024-02-23 NOTE — ED Provider Notes (Signed)
 [place] EMERGENCY DEPARTMENT AT [facility] Provider Note   CSN: [id number] Arrival date & time: 02/23/24  0128     Patient presents with: Seizures   Kathleen Henderson is a 18 y.o. female.   Patient brought to the emergency department after suffering 2 brief seizures.  Patient has a history of what appears to be mostly nonepileptic seizures in the past.  Patient seen multiple times recently in the ED for same.       Prior to Admission medications   Medication Sig Start Date End Date Taking? Authorizing Provider  Cholecalciferol (VITAMIN D3) 1.25 MG (50000 UT) CAPS Take 1 capsule by mouth once a week. 08/26/23   [provider]  EPINEPHrine (PRIMATENE MIST) 0.125 MG/ACT AERO Inhale 1 puff into the lungs.    [provider]  fluticasone  (FLONASE ) 50 MCG/ACT nasal spray Place 1 spray into both nostrils daily. 08/30/23   [provider]  guanFACINE (TENEX) 1 MG tablet Take 1 mg by mouth at bedtime.    [provider]  lamoTRIgine  (LAMICTAL ) 100 MG tablet Take 1 tablet (100 mg total) by mouth daily. Patient not taking: Reported on 02/15/2024 10/03/23 10/02/24  Sebastian Toribio GAILS, MD  lamoTRIgine  (LAMICTAL ) 25 MG tablet Take 1 tablet (25mg ) once daily for 2 weeks, then 2 tablets (50mg ) once daily for 2 weeks, then 4 tablets (100mg ) once daily for one week 08/29/23   Sebastian Toribio GAILS, MD  levETIRAcetam  (KEPPRA ) 500 MG tablet Take 500 mg by mouth 2 (two) times daily.    [provider]  metroNIDAZOLE  (FLAGYL ) 500 MG tablet Take 1 tablet (500 mg total) by mouth 2 (two) times daily. 02/15/24   Cresenzo-Dishmon, Cathlean, CNM  naproxen (NAPROSYN) 500 MG tablet Take 500 mg by mouth 2 (two) times daily as needed. 10/19/23   [provider]  ondansetron  (ZOFRAN -ODT) 4 MG disintegrating tablet Take 1 tablet (4 mg total) by mouth every 8 (eight) hours as needed. 08/15/23   Chandra Harlene LABOR, NP  OVER THE COUNTER MEDICATION Take 1 tablet by mouth  daily as needed (nausea, vomiting). Unknown OTC nausea medication    [provider]  pantoprazole  (PROTONIX ) 40 MG tablet Take 1 tablet (40 mg total) by mouth at bedtime. 08/29/23   Sebastian Toribio GAILS, MD  Prenatal Vit-Fe Fumarate-FA (PNV PRENATAL PLUS MULTIVITAMIN) 27-1 MG TABS Take 1 tablet by mouth daily. 02/15/24   Cresenzo-Dishmon, Cathlean, CNM    Allergies: Cheese, Penicillins, Trileptal  [oxcarbazepine ], and Wellbutrin [bupropion]    Review of Systems  Updated Vital Signs BP 115/73   Pulse (!) 55   Temp 98.2 F (36.8 C) (Temporal)   Resp 18   Ht 5' 7 (1.702 m)   Wt 68 kg   LMP 01/04/2024   SpO2 100%   BMI 23.49 kg/m   Physical Exam Vitals and nursing note reviewed.  Constitutional:      General: She is not in acute distress.    Appearance: She is well-developed.  HENT:     Head: Normocephalic and atraumatic.     Mouth/Throat:     Mouth: Mucous membranes are moist.  Eyes:     General: Vision grossly intact. Gaze aligned appropriately.     Extraocular Movements: Extraocular movements intact.     Conjunctiva/sclera: Conjunctivae normal.  Cardiovascular:     Rate and Rhythm: Normal rate and regular rhythm.     Pulses: Normal pulses.     Heart sounds: Normal heart sounds, S1 normal and S2 normal.  No murmur heard.    No friction rub. No gallop.  Pulmonary:     Effort: Pulmonary effort is normal. No respiratory distress.     Breath sounds: Normal breath sounds.  Abdominal:     General: Bowel sounds are normal.     Palpations: Abdomen is soft.     Tenderness: There is no abdominal tenderness. There is no guarding or rebound.     Hernia: No hernia is present.  Musculoskeletal:        General: No swelling.     Cervical back: Full passive range of motion without pain, normal range of motion and neck supple. No spinous process tenderness or muscular tenderness. Normal range of motion.     Right lower leg: No edema.     Left lower leg: No edema.  Skin:     General: Skin is warm and dry.     Capillary Refill: Capillary refill takes less than 2 seconds.     Findings: No ecchymosis, erythema, rash or wound.  Neurological:     General: No focal deficit present.     Mental Status: She is alert and oriented to person, place, and time.     GCS: GCS eye subscore is 4. GCS verbal subscore is 5. GCS motor subscore is 6.     Cranial Nerves: Cranial nerves 2-12 are intact.     Sensory: Sensation is intact.     Motor: Motor function is intact.     Coordination: Coordination is intact.  Psychiatric:        Attention and Perception: Attention normal.        Mood and Affect: Mood normal.        Speech: Speech normal.        Behavior: Behavior normal.     (all labs ordered are listed, but only abnormal results are displayed) Labs Reviewed  CBG MONITORING, ED - Abnormal; Notable for the following components:      Result Value   Glucose-Capillary 105 (*)    All other components within normal limits    EKG: EKG Interpretation Date/Time:  Friday February 23 2024 01:33:35 EDT Ventricular Rate:  58 PR Interval:  156 QRS Duration:  72 QT Interval:  398 QTC Calculation: 391 R Axis:   80  Text Interpretation: Sinus arrhythmia Low voltage, precordial leads Confirmed by Haze Lonni PARAS [phone]) on 02/23/2024 2:06:59 AM  Radiology: CT Cervical Spine Wo Contrast Result Date: 02/22/2024 CLINICAL DATA:  18 year old female with altered mental status, history of seizure. Neck pain. Unknown trauma. EXAM: CT CERVICAL SPINE WITHOUT CONTRAST TECHNIQUE: Multidetector CT imaging of the cervical spine was performed without intravenous contrast. Multiplanar CT image reconstructions were also generated. RADIATION DOSE REDUCTION: This exam was performed according to the departmental dose-optimization program which includes automated exposure control, adjustment of the mA and/or kV according to patient size and/or use of iterative reconstruction technique. COMPARISON:   Head CT today.  Cervical spine CT 05/04/2019. FINDINGS: Alignment: Chronic reversal of the normal cervical lordosis has not significantly changed since 2020. Underlying mild levoconvex cervical scoliosis today. Cervicothoracic junction alignment is within normal limits. Maintained posterior element alignment. Skull base and vertebrae: Bone mineralization is within normal limits. Visualized skull base is intact. No atlanto-occipital dissociation. C1 and C2 appear intact and aligned. No acute osseous abnormality identified. Soft tissues and spinal canal: No prevertebral fluid or swelling. No visible canal hematoma. Negative visible noncontrast neck soft tissues. Disc levels:  Negative. Upper chest: Negative visible noncontrast thoracic inlet.  IMPRESSION: No acute traumatic injury identified in the cervical spine. Electronically Signed   By: VEAR Hurst M.D.   On: 02/22/2024 06:50   CT Head Wo Contrast Result Date: 02/22/2024 CLINICAL DATA:  18 year old female with altered mental status, history of seizure. Neck pain. Unknown trauma. EXAM: CT HEAD WITHOUT CONTRAST TECHNIQUE: Contiguous axial images were obtained from the base of the skull through the vertex without intravenous contrast. RADIATION DOSE REDUCTION: This exam was performed according to the departmental dose-optimization program which includes automated exposure control, adjustment of the mA and/or kV according to patient size and/or use of iterative reconstruction technique. COMPARISON:  Head CT 09/20/2023. FINDINGS: Brain: Normal cerebral volume. No midline shift, ventriculomegaly, mass effect, evidence of mass lesion, intracranial hemorrhage or evidence of cortically based acute infarction. Gray-white matter differentiation is within normal limits throughout the brain. Vascular: No suspicious intracranial vascular hyperdensity. Skull: Stable and intact. Sinuses/Orbits: Small chronic left maxillary sinus mucous retention cysts. Other Visualized paranasal  sinuses and mastoids are stable and well aerated. Other: Visualized orbits and scalp soft tissues are within normal limits. IMPRESSION: No acute traumatic injury identified. Stable and normal noncontrast CT appearance of the brain. Electronically Signed   By: VEAR Hurst M.D.   On: 02/22/2024 06:39     Procedures   Medications Ordered in the ED - No data to display                                  Medical Decision Making  Patient with history of nonepileptic seizures presents to the ED after experiencing 2 brief seizure-like episodes prehospital.  She has been seizure-free here in the ED, multiple workups recently.  Patient seen this morning for same and Lamictal /Keppra  levels are pending.  No further interventions necessary today, await those results and follow-up with PCP and neurology.     Final diagnoses:  Seizure-like activity [facility])  Psychogenic nonepileptic seizure    ED Discharge Orders     None          Hermes Wafer, Lonni PARAS, MD 02/23/24 [id number]

## 2024-02-23 NOTE — ED Triage Notes (Signed)
 Pt arrived via [facility] from home. Pts friend called 911 for a witnessed seizure event. EMS reports she was laying on left side when they arrived. Pt did have 1 seizure in house before transport that lasted around 30 seconds. Denies hitting head. Pt had another seizure in EMS. Given 2.5 of versed and a 500 ml NS. C collar in place.  108/60 105 BG

## 2024-02-24 ENCOUNTER — Emergency Department (HOSPITAL_BASED_OUTPATIENT_CLINIC_OR_DEPARTMENT_OTHER)
Admission: EM | Admit: 2024-02-24 | Discharge: 2024-02-24 | Disposition: A | Discharge status: Home / Self Care | Payer: MEDICAID | Attending: Emergency Medicine | Admitting: Emergency Medicine

## 2024-02-24 ENCOUNTER — Other Ambulatory Visit: Payer: Self-pay

## 2024-02-24 DIAGNOSIS — F445 Conversion disorder with seizures or convulsions: Secondary | ICD-10-CM | POA: Diagnosis not present

## 2024-02-24 DIAGNOSIS — Z79899 Other long term (current) drug therapy: Secondary | ICD-10-CM | POA: Diagnosis not present

## 2024-02-24 DIAGNOSIS — R519 Headache, unspecified: Secondary | ICD-10-CM | POA: Diagnosis present

## 2024-02-24 LAB — LEVETIRACETAM LEVEL: Levetiracetam Lvl: 22.5 ug/mL (ref 10.0–40.0)

## 2024-02-24 MED ORDER — LAMOTRIGINE 25 MG PO TABS
25.0000 mg | ORAL_TABLET | Freq: Once | ORAL | Status: AC
  Administered 2024-02-24: 25 mg via ORAL
  Filled 2024-02-24: qty 1

## 2024-02-24 MED ORDER — LEVETIRACETAM 500 MG PO TABS
500.0000 mg | ORAL_TABLET | Freq: Once | ORAL | Status: AC
  Administered 2024-02-24: 500 mg via ORAL
  Filled 2024-02-24: qty 1

## 2024-02-24 MED ORDER — ACETAMINOPHEN 500 MG PO TABS
1000.0000 mg | ORAL_TABLET | Freq: Once | ORAL | Status: AC
  Administered 2024-02-24: 1000 mg via ORAL
  Filled 2024-02-24: qty 2

## 2024-02-24 NOTE — Discharge Instructions (Signed)
 1.  You need to spread your Keppra  dosing out by 12 hours.  It is meant to be taken twice daily.  You can choose what schedule you want but the 2 doses need to be separated by 9 to 12 hours. 2.  Continue all of your other regularly prescribed medications. 3.  Follow-up with your outpatient providers as soon as possible.

## 2024-02-24 NOTE — ED Provider Notes (Signed)
 [place] EMERGENCY DEPARTMENT AT [facility] Provider Note   CSN: [id number] Arrival date & time: 02/24/24  1540     Patient presents with: Headache and Seizures   Kathleen Henderson is a 18 y.o. female.   HPI Patient reports that she has had a burning quality headache.  She reports that starts to feel like her scalp is burning from the back of her head and then it moves forward.  She reports when this happens then she often gets seizure activity.  Reportedly the patient had several seizures yesterday.  They lasted about 30 seconds and spontaneously resolved.  Patient reports that she has not taken her Keppra  today.  She reports she has an irregular sleep schedule and takes both doses together at around 7 PM.  Patient was seen in the emergency department several times this month and had 1 hospitalization with discharge on 7\11 for depression with suicidal ideation.  At that time patient was discharged with instructions to take Keppra  500 mg twice daily and Lamictal  25 mg daily.    Prior to Admission medications   Medication Sig Start Date End Date Taking? Authorizing Provider  Cholecalciferol (VITAMIN D3) 1.25 MG (50000 UT) CAPS Take 1 capsule by mouth once a week. 08/26/23   [provider]  EPINEPHrine (PRIMATENE MIST) 0.125 MG/ACT AERO Inhale 1 puff into the lungs.    [provider]  fluticasone  (FLONASE ) 50 MCG/ACT nasal spray Place 1 spray into both nostrils daily. 08/30/23   [provider]  guanFACINE (TENEX) 1 MG tablet Take 1 mg by mouth at bedtime.    [provider]  lamoTRIgine  (LAMICTAL ) 100 MG tablet Take 1 tablet (100 mg total) by mouth daily. Patient not taking: Reported on 02/15/2024 10/03/23 10/02/24  Sebastian Toribio GAILS, MD  lamoTRIgine  (LAMICTAL ) 25 MG tablet Take 1 tablet (25mg ) once daily for 2 weeks, then 2 tablets (50mg ) once daily for 2 weeks, then 4 tablets (100mg ) once daily for one week 08/29/23   Sebastian Toribio GAILS, MD   levETIRAcetam  (KEPPRA ) 500 MG tablet Take 500 mg by mouth 2 (two) times daily.    [provider]  metroNIDAZOLE  (FLAGYL ) 500 MG tablet Take 1 tablet (500 mg total) by mouth 2 (two) times daily. 02/15/24   Cresenzo-Dishmon, Cathlean, CNM  naproxen (NAPROSYN) 500 MG tablet Take 500 mg by mouth 2 (two) times daily as needed. 10/19/23   [provider]  ondansetron  (ZOFRAN -ODT) 4 MG disintegrating tablet Take 1 tablet (4 mg total) by mouth every 8 (eight) hours as needed. 08/15/23   Chandra Harlene LABOR, NP  OVER THE COUNTER MEDICATION Take 1 tablet by mouth daily as needed (nausea, vomiting). Unknown OTC nausea medication    [provider]  pantoprazole  (PROTONIX ) 40 MG tablet Take 1 tablet (40 mg total) by mouth at bedtime. 08/29/23   Sebastian Toribio GAILS, MD  Prenatal Vit-Fe Fumarate-FA (PNV PRENATAL PLUS MULTIVITAMIN) 27-1 MG TABS Take 1 tablet by mouth daily. 02/15/24   Cresenzo-Dishmon, Cathlean, CNM    Allergies: Cheese, Penicillins, Trileptal  [oxcarbazepine ], and Wellbutrin [bupropion]    Review of Systems  Updated Vital Signs BP 120/75 (BP Location: Right Arm)   Pulse 64   Temp 98 F (36.7 C) (Oral)   Resp 16   LMP 02/08/2024   SpO2 100%   Physical Exam Constitutional:      Comments: Alert nontoxic.  No acute distress.  HENT:     Head: Normocephalic and atraumatic.     Right Ear: Tympanic  membrane normal.     Left Ear: Tympanic membrane normal.     Nose: Nose normal.     Mouth/Throat:     Mouth: Mucous membranes are moist.     Pharynx: Oropharynx is clear.  Eyes:     Extraocular Movements: Extraocular movements intact.     Conjunctiva/sclera: Conjunctivae normal.     Pupils: Pupils are equal, round, and reactive to light.  Cardiovascular:     Rate and Rhythm: Normal rate and regular rhythm.  Pulmonary:     Effort: Pulmonary effort is normal.     Breath sounds: Normal breath sounds.  Abdominal:     General: There is no distension.     Palpations:  Abdomen is soft.     Tenderness: There is no abdominal tenderness. There is no guarding.  Musculoskeletal:        General: No swelling or tenderness. Normal range of motion.     Cervical back: Neck supple.  Skin:    General: Skin is warm and dry.  Neurological:     Comments: No focal neurologic deficit.  Patient is awake alert and answering questions.  She can follow commands without motor deficit.  Psychiatric:        Mood and Affect: Mood normal.     (all labs ordered are listed, but only abnormal results are displayed) Labs Reviewed - No data to display  EKG: None  Radiology: No results found.   Procedures   Medications Ordered in the ED  levETIRAcetam  (KEPPRA ) tablet 500 mg (500 mg Oral Given 02/24/24 1710)  lamoTRIgine  (LAMICTAL ) tablet 25 mg (25 mg Oral Given 02/24/24 1710)  acetaminophen  (TYLENOL ) tablet 1,000 mg (1,000 mg Oral Given 02/24/24 1711)                                    Medical Decision Making Risk OTC drugs. Prescription drug management.   EMR reviewed.  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  She has had extensive diagnostic evaluation and is prescribed Keppra  and Lamictal .  Patient reports that she takes the Keppra  as a single dose at 7 PM because of her irregular sleep schedule.  At this time I feel this is a likely contributor to some additional breakthrough seizure-like activity more recently.  I discussed the importance of separating the dosing by 9 to 12 hours before improved functionality of the Keppra .  Patient was given a dose of Keppra  and Lamictal  in the emergency department.  She is tolerated this well.  There has been no further seizure activity.  At this time I do feel she is stable for discharge and continued outpatient management.     Final diagnoses:  Psychogenic nonepileptic seizure    ED Discharge Orders     None          Armenta Canning, MD 02/24/24 [phone]

## 2024-02-24 NOTE — ED Triage Notes (Signed)
 Pt to ED POV reporting headache x 2 days with blurred vision and neck pain. Patient has hx of seizures and reports multiple seizures yesterday. Patient compliant with seizure medications. No recent head injury.
